# Patient Record
Sex: Female | Born: 1995 | Race: White | Hispanic: No | Marital: Single | Smoking: Former smoker
Health system: Southern US, Community
[De-identification: ages and names within clinical notes are randomized; demographics above are authoritative.]

## PROBLEM LIST (undated history)

## (undated) DIAGNOSIS — R51 Headache: Secondary | ICD-10-CM

## (undated) HISTORY — DX: Headache: R51

---
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
## 2017-02-04 ENCOUNTER — Telehealth: Payer: Self-pay | Admitting: *Deleted

## 2017-02-04 MED ORDER — NORETHIN-ETH ESTRAD-FE BIPHAS 1 MG-10 MCG / 10 MCG PO TABS: 1.0000 | ORAL_TABLET | Freq: Every day | ORAL | 0 refills | Status: AC

## 2017-02-04 NOTE — Telephone Encounter (Signed)
Patient called requesting to start back on lo Loestrin Fe, now has new insurance. Okay to send?

## 2017-02-04 NOTE — Telephone Encounter (Signed)
Okay for lo Loestrin FE, #3 packs start on first day of cycle or Sunday after bleeding starts take daily,  first month not contraceptive, best to use condoms until permanent partner. Annual is due in November.

## 2017-02-04 NOTE — Telephone Encounter (Signed)
Pt has moved to [place] aware she needs to find a MD as only 3 packs will be sent. Rx sent

## 2017-06-07 IMAGING — CR DG CHEST 2V
2 series · 2 of 2 positions shown · non-contrast
Comparison: None.

CLINICAL DATA: Chest pain and shortness of breath.  Ex-smoker.

EXAM:
CHEST  2 VIEW

[chest pa]
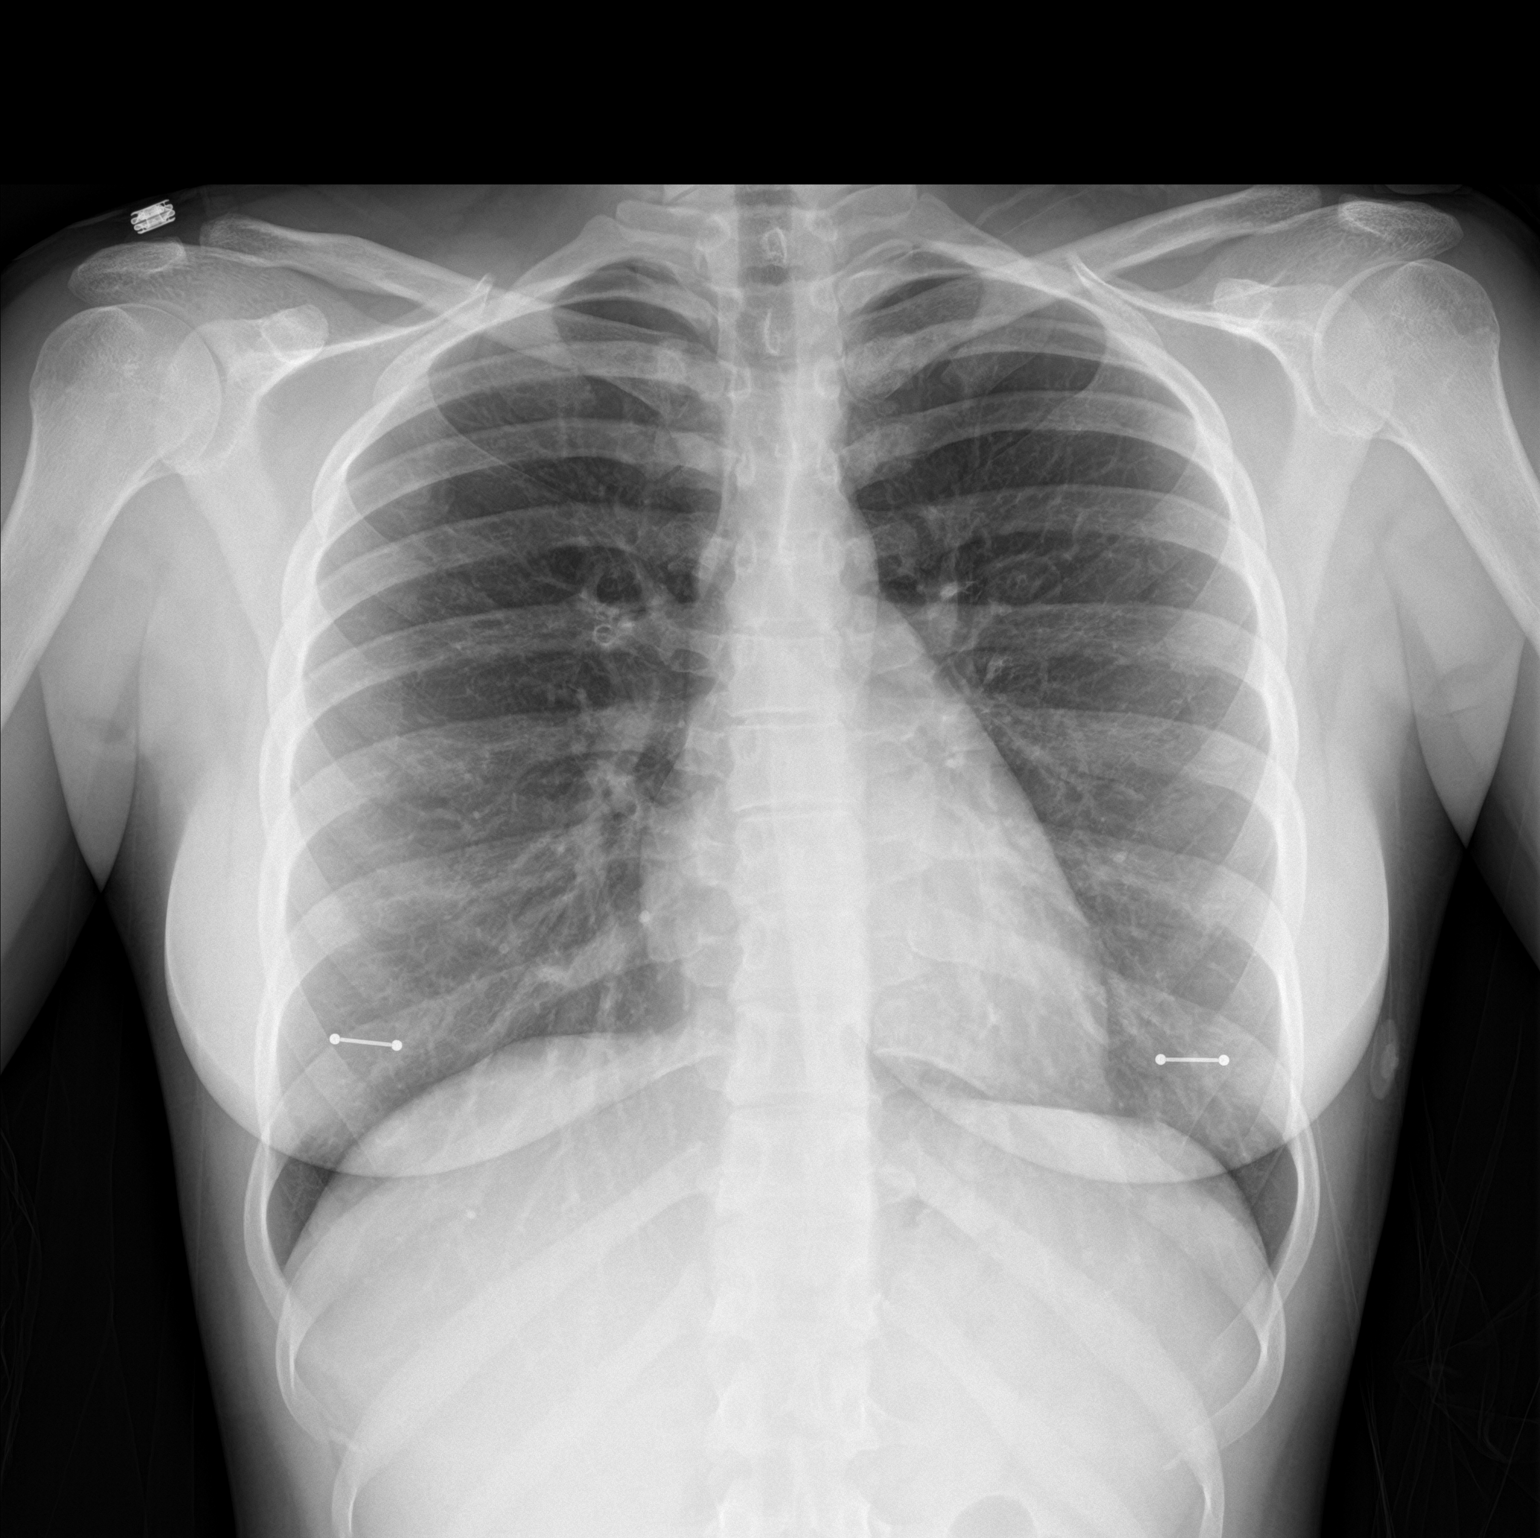

[chest lat]
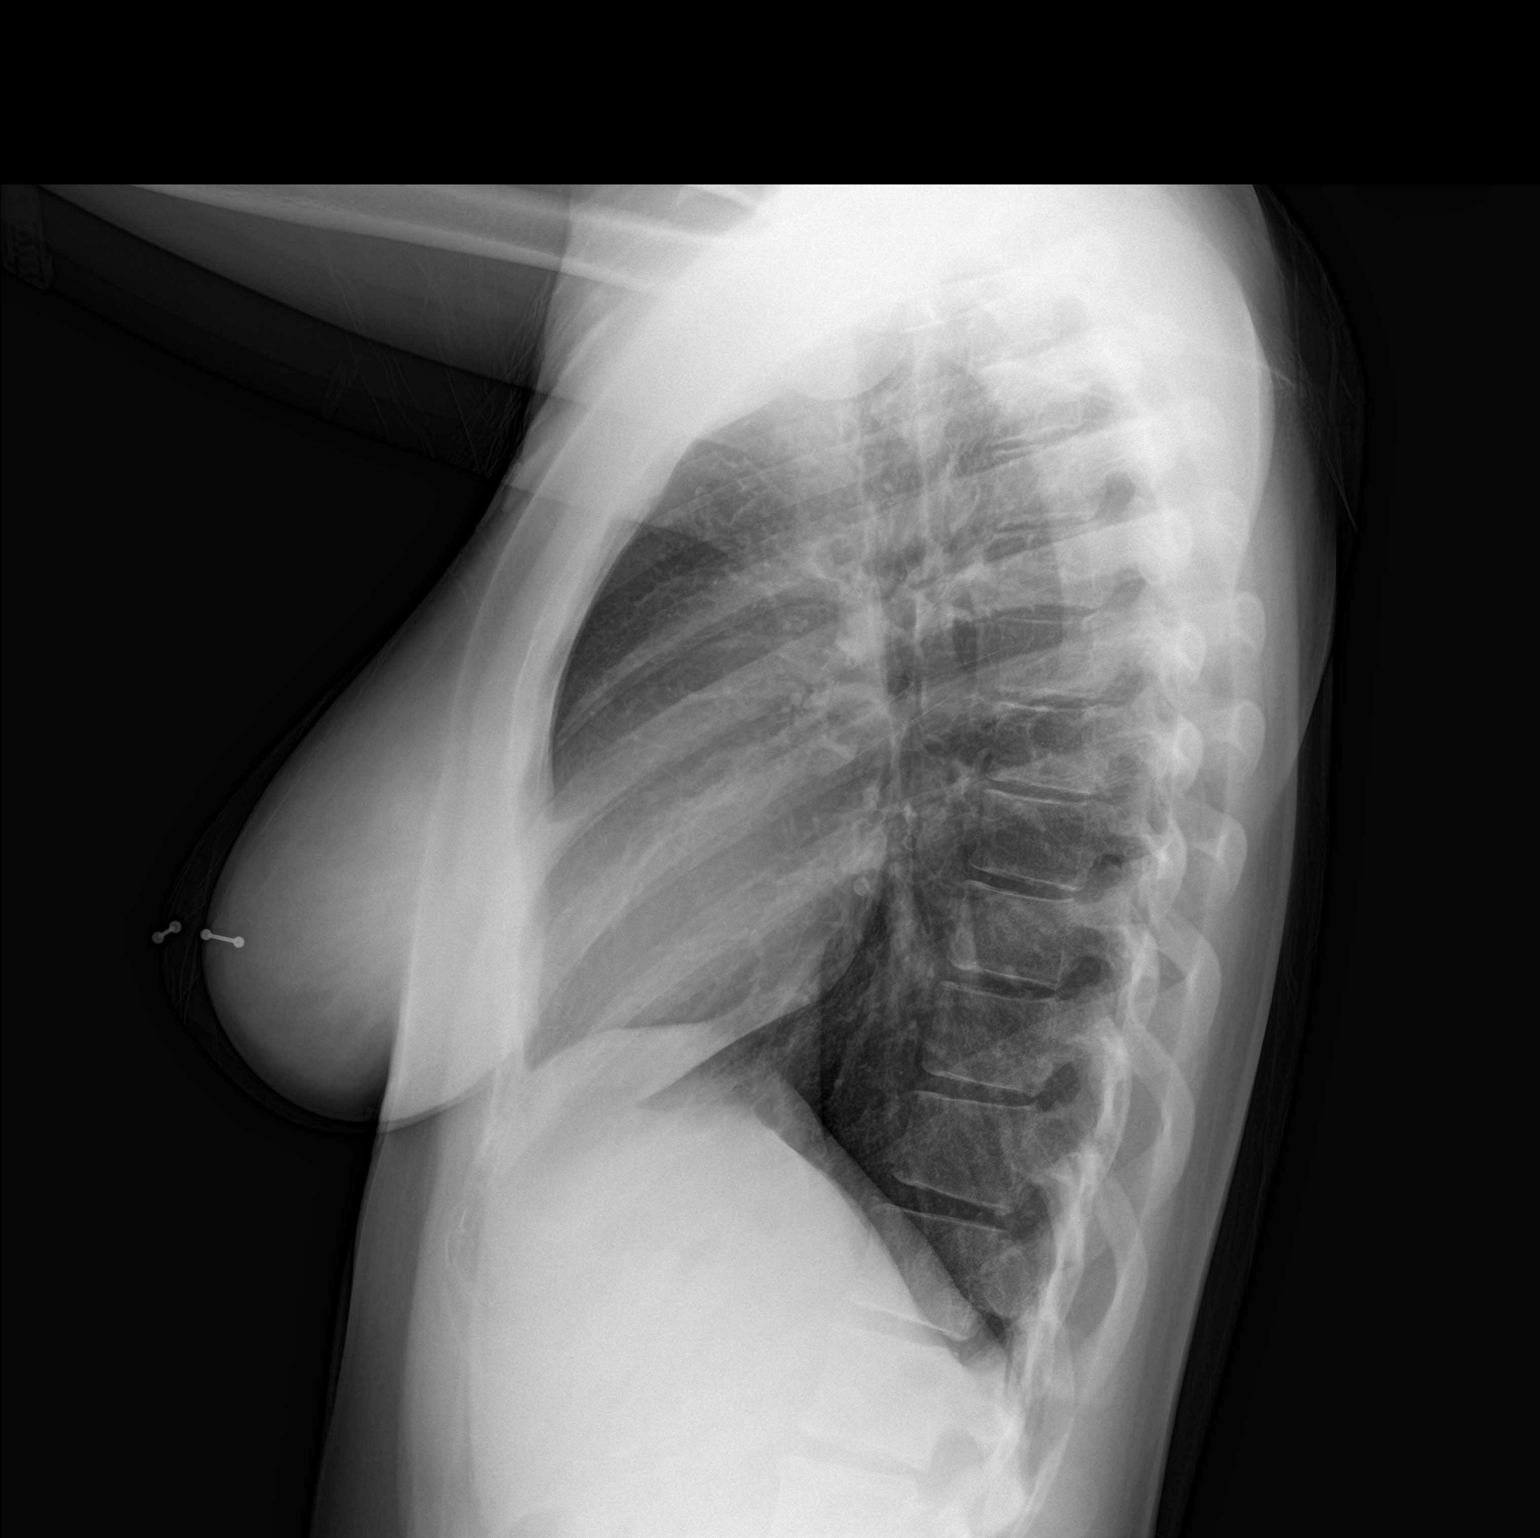

[2 of 2 positions shown; findings below may reference images not displayed]

FINDINGS: Midline trachea. Normal heart size and mediastinal contours. No
pleural effusion or pneumothorax. Clear lungs. Nipple piercings.
IMPRESSION: No acute cardiopulmonary disease.
